# Patient Record
Sex: Female | Born: 1935 | Race: White | Hispanic: No | Marital: Married | Smoking: Never smoker
Health system: Southern US, Community
[De-identification: ages and names within clinical notes are randomized; demographics above are authoritative.]

## PROBLEM LIST (undated history)

## (undated) DIAGNOSIS — J45909 Unspecified asthma, uncomplicated: Secondary | ICD-10-CM

## (undated) HISTORY — PX: COLON RESECTION: SHX5231

## (undated) HISTORY — PX: BACK SURGERY: SHX140

## (undated) HISTORY — PX: CHOLECYSTECTOMY: SHX55

## (undated) HISTORY — PX: MINOR CARPAL TUNNEL: SHX6472

---

## 2017-04-21 ENCOUNTER — Ambulatory Visit: Payer: Medicare Other

## 2017-04-21 ENCOUNTER — Ambulatory Visit
Admission: EM | Admit: 2017-04-21 | Discharge: 2017-04-21 | Disposition: A | Discharge status: Home / Self Care | Payer: Medicare Other | Attending: Family Medicine | Admitting: Family Medicine

## 2017-04-21 DIAGNOSIS — E039 Hypothyroidism, unspecified: Secondary | ICD-10-CM | POA: Diagnosis not present

## 2017-04-21 DIAGNOSIS — K219 Gastro-esophageal reflux disease without esophagitis: Secondary | ICD-10-CM | POA: Diagnosis not present

## 2017-04-21 DIAGNOSIS — R110 Nausea: Secondary | ICD-10-CM

## 2017-04-21 DIAGNOSIS — R1013 Epigastric pain: Secondary | ICD-10-CM

## 2017-04-21 DIAGNOSIS — Z79899 Other long term (current) drug therapy: Secondary | ICD-10-CM | POA: Insufficient documentation

## 2017-04-21 DIAGNOSIS — K5900 Constipation, unspecified: Secondary | ICD-10-CM | POA: Insufficient documentation

## 2017-04-21 DIAGNOSIS — I10 Essential (primary) hypertension: Secondary | ICD-10-CM | POA: Insufficient documentation

## 2017-04-21 DIAGNOSIS — Z9049 Acquired absence of other specified parts of digestive tract: Secondary | ICD-10-CM | POA: Insufficient documentation

## 2017-04-21 HISTORY — DX: Unspecified asthma, uncomplicated: J45.909

## 2017-04-21 LAB — BASIC METABOLIC PANEL
Anion gap: 8 (ref 5–15)
BUN: 15 mg/dL (ref 6–20)
CALCIUM: 9 mg/dL (ref 8.9–10.3)
CO2: 29 mmol/L (ref 22–32)
CREATININE: 0.76 mg/dL (ref 0.44–1.00)
Chloride: 99 mmol/L — ABNORMAL LOW (ref 101–111)
GFR calc Af Amer: >60 mL/min (ref >60)
GLUCOSE: 120 mg/dL — AB (ref 65–99)
POTASSIUM: 3.5 mmol/L (ref 3.5–5.1)
SODIUM: 136 mmol/L (ref 135–145)

## 2017-04-21 LAB — CBC WITH DIFFERENTIAL/PLATELET
BASOS ABS: 0 10*3/uL (ref 0–0.1)
BASOS PCT: 1 %
EOS ABS: 0.1 10*3/uL (ref 0–0.7)
Eosinophils Relative: 3 %
HCT: 43 % (ref 35.0–47.0)
Hemoglobin: 14.5 g/dL (ref 12.0–16.0)
LYMPHS ABS: 1.6 10*3/uL (ref 1.0–3.6)
Lymphocytes Relative: 30 %
MCH: 30.1 pg (ref 26.0–34.0)
MCHC: 33.7 g/dL (ref 32.0–36.0)
MCV: 89.6 fL (ref 80.0–100.0)
Monocytes Absolute: 0.8 10*3/uL (ref 0.2–0.9)
Monocytes Relative: 14 %
NEUTROS PCT: 52 %
Neutro Abs: 2.9 10*3/uL (ref 1.4–6.5)
PLATELETS: 215 10*3/uL (ref 150–440)
RBC: 4.8 MIL/uL (ref 3.80–5.20)
RDW: 12.9 % (ref 11.5–14.5)
WBC: 5.5 10*3/uL (ref 3.6–11.0)

## 2017-04-21 LAB — LIPASE, BLOOD: Lipase: 28 U/L (ref 11–51)

## 2017-04-21 NOTE — ED Provider Notes (Signed)
[facility] ____________________________________________  Time seen: Approximately 3:05 PM  I have reviewed the triage vital signs and the nursing notes.   HISTORY  Chief Complaint Nausea   HPI Carolyn Olson is a 81 y.o. female the past medical history of colon resection secondary to frequent diverticulitis, constipation with impaction, hypertension, hypothyroidism, acid reflux presenting for evaluation of epigastric abdominal pain. Patient states that approximately 3-4 weeks ago she had a standard follow-up colonoscopy performed. Patient reports initially after the colonoscopy she was fine without complication. Patient reports the next week she had onset of right lower quadrant abdominal pain that is worse with leg movement and then was seen in the emergency room in which she had a negative workup including laboratory studies as well as negative CT scan per patient. Patient reports the next week she then began having constipation and she was impacted in which she mainly disimpacted and took Metamucil and Colace which relieved the impaction. Patient states she is insensate and not moving her bowels with normal as the for herself.   Patient reports over the last week she's been having intermittent nausea and burping. States over the last 3-4 days she has had increased burping, acid reflux sensation including burping up acidic and sour tasting fluids and some burning in her throat sensation. Denies sore throat. Patient states that these symptoms were consistent with her previous acid reflux but more persistent and not resolved with over-the-counter Alka-Seltzer. Patient states today the burning and the discomfort has fully resolved but continues with burping and intermittent epigastric abdominal pain. Patient states the abdominal pain comes and goes denies aggravating or alleviating factors. Reports when present she feels nauseated. States yesterday she made herself vomit once to  try to alleviate the pain and nausea but states unsuccessful. Denies fall, trauma. States felt warm yesterday, denies known fevers. Denies dysuria, back pain, chest pain, shortness breath, pain with deep breath. Denies runny nose, cough, congestion or sore throat. Reports continues to tolerate fluids well but not eating as much. Denies melena, hematochezia, blood in stool or toilet, abnormal colored stools. Denies diarrhea. Reports continues to pass gas normally. Patient states that she does not take her acid reflux medication everyday like she is supposed to. Reports otherwise feels well.  [place] in [place]. Patient reports she is here visiting her family. States to return home this coming Tuesday.    Past Medical History:  Diagnosis Date  . Asthma   Hypertension Constipation Acid reflux  There are no active problems to display for this patient.   Past Surgical History:  Procedure Laterality Date  . BACK SURGERY    . CHOLECYSTECTOMY    . COLON RESECTION    . MINOR CARPAL TUNNEL       No current facility-administered medications for this encounter.   Current Outpatient Prescriptions:  .  albuterol (PROVENTIL HFA;VENTOLIN HFA) 108 (90 Base) MCG/ACT inhaler, Inhale 2 puffs into the lungs every 6 (six) hours as needed for wheezing or shortness of breath., Disp: , Rfl:  .  amLODipine (NORVASC) 2.5 MG tablet, Take 2.5 mg by mouth daily., Disp: , Rfl:  .  budesonide (PULMICORT) 180 MCG/ACT inhaler, Inhale 1 puff into the lungs 2 (two) times daily., Disp: , Rfl:  .  furosemide (LASIX) 40 MG tablet, Take 40 mg by mouth., Disp: , Rfl:  .  levothyroxine (SYNTHROID, LEVOTHROID) 125 MCG tablet, Take 125 mcg by mouth daily before breakfast., Disp: , Rfl:  .  metoprolol tartrate (LOPRESSOR) 25 MG  tablet, Take 25 mg by mouth 2 (two) times daily., Disp: , Rfl:  .  montelukast (SINGULAIR) 10 MG tablet, Take 10 mg by mouth at bedtime., Disp: , Rfl:  .  omeprazole (PRILOSEC) 20 MG capsule, Take 20  mg by mouth daily., Disp: , Rfl:   Allergies Dye fdc red [red dye]; Prozac [fluoxetine hcl]; and Theophyllines  History reviewed. No pertinent family history.  Social History Social History  Substance Use Topics  . Smoking status: Never Smoker  . Smokeless tobacco: Never Used  . Alcohol use No    Review of Systems Constitutional: As above.  ENT: No sore throat. Cardiovascular: Denies chest pain. Respiratory: Denies shortness of breath. Gastrointestinal: As above. Reports had two bowel movements today and described as normal.  Genitourinary: Negative for dysuria. Musculoskeletal: Negative for back pain. Skin: Negative for rash.   ____________________________________________   PHYSICAL EXAM:  VITAL SIGNS: ED Triage Vitals  Enc Vitals Group     BP 04/21/17 1419 (!) 157/81     Pulse Rate 04/21/17 1419 90     Resp 04/21/17 1419 18     Temp 04/21/17 1419 98.8 F (37.1 C)     Temp Source 04/21/17 1419 Oral     SpO2 04/21/17 1419 97 %     Weight --      Height --      Head Circumference --      Peak Flow --      Pain Score 04/21/17 1421 10     Pain Loc --      Pain Edu? --      Excl. in GC? --     Constitutional: Alert and oriented. Well appearing and in no acute distress. ENT      Head: Normocephalic and atraumatic.      Mouth/Throat: Mucous membranes are moist.Oropharynx non-erythematous. Cardiovascular: Normal rate, regular rhythm. Grossly normal heart sounds.  Good peripheral circulation. Respiratory: Normal respiratory effort without tachypnea nor retractions. Breath sounds are clear and equal bilaterally. No wheezes, rales, rhonchi. Gastrointestinal: No CVA tenderness. Musculoskeletal:  No midline cervical, thoracic or lumbar tenderness to palpation. Ambulatory with steady gait.  Neurologic:  Normal speech and language. Speech is normal.  Skin:  Skin is warm, dry. Psychiatric: Mood and affect are normal. Speech and behavior are normal. Patient exhibits  appropriate insight and judgment   ___________________________________________   LABS (all labs ordered are listed, but only abnormal results are displayed)  Labs Reviewed  BASIC METABOLIC PANEL - Abnormal; Notable for the following:       Result Value   Chloride 99 (*)    Glucose, Bld 120 (*)    All other components within normal limits  CBC WITH DIFFERENTIAL/PLATELET  LIPASE, BLOOD    ED ECG REPORT I, Renford Dills, the attending provider and Dr Judd Gaudier, personally viewed and interpreted this ECG.   Date: 04/21/2017  EKG Time: 1513  Rate: 80  Rhythm: normal sinus rhythm  Axis: normal  Intervals:none  ST&T Change: none   RADIOLOGY  Dg Abdomen 1 View  Result Date: 04/21/2017 CLINICAL DATA:  Abdominal pain, recent colonoscopy EXAM: ABDOMEN - 1 VIEW COMPARISON:  None. FINDINGS: Normal bowel gas pattern. If there is concern of free air, upright or decubitus view suggested. Surgical bowel clips in the pelvis in the region of the rectum. Surgical clips in the region of the right kidney. Multilevel lumbar degenerative change and dextroscoliosis. IMPRESSION: No acute abnormality. Electronically Signed   By: Marlan Palau M.D.  On: 04/21/2017 15:39   ____________________________________________   PROCEDURES Procedures    INITIAL IMPRESSION / ASSESSMENT AND PLAN / ED COURSE  Pertinent labs & imaging results that were available during my care of the patient were reviewed by me and considered in my medical decision making (see chart for details). Discussed patient and plan of care with Dr Judd Gaudier, who agrees with plan.   Well-appearing patient. No acute distress. Patient with above past medical history and recent evaluation including colonoscopy as well as ER visit with reported CT scan. Patient states due to her IVP contrast allergy she was only had CT without contrast. Patient states that she feels like this is all secondary from previous colonoscopy. Discussed patient clinical  report suggestive of gastritis, but concern regarding past medical history. Discussed with patient we'll evaluate EKG, KUB and laboratory studies. Discussed the patient likely need endoscopy but may need further CT imaging rather imaging and evaluation in ER.   Laboratory results reviewed, appears overall unremarkable. KUB reviewed, per radiologist no acute abnormality noted. Discussed in detail with patient concern regarding gastritis and plan for patient to take oral omeprazole 20 mg twice daily. Patient then states that she was originally told to take it twice a day but states does not, and does not always even take it once daily. Encouraged patient to take twice daily as prescribed. Discussed avoiding or relieving factors and triggers. Discussed close monitoring. Discussed in detail with patient for any increased pain, vomiting, abnormal color stools or other concerns proceed directly to emergency room for further evaluation including likely other imaging. Discussed with patient following up with GI at home for likely endoscopy. Discussed indication, risks and benefits of medications with patient.patient states that she has plenty of omeprazole with her and does not need additional Rx.   Discussed follow up with Primary care physician this week. Discussed follow up and return parameters including no resolution or any worsening concerns. Patient verbalized understanding and agreed to plan.   ____________________________________________   FINAL CLINICAL IMPRESSION(S) / ED DIAGNOSES  Final diagnoses:  Epigastric abdominal pain     Discharge Medication List as of 04/21/2017  4:17 PM      Note: This dictation was prepared with Dragon dictation along with smaller phrase technology. Any transcriptional errors that result from this process are unintentional.           Renford Dills, NP 04/21/17 1630

## 2017-04-21 NOTE — Discharge Instructions (Signed)
Take omeprazole twice daily on a regular basis. Rest. Drink plenty of fluids. Avoid aggravating factors.   Follow up with your primary care physician or your gastroenterologist this week. Return to Urgent care or Emergency room for increased pain, vomiting, blood in stool, new or worsening concerns.

## 2017-04-21 NOTE — ED Triage Notes (Addendum)
Pt c/o upper abdominal pain, that comes and goes. Had a colonoscopy 3 weeks ago and ever since then she has had nausea and pain in the center of her abdomen. Last BM this am. Constant nausea. She had a pain in her lower hip area anterior and was taken to the hospital in [place], they did blood work, and scans and everything was clear. She feels this is all coming from the colonoscopy. She was severely constipated and had to manually get stool out a couple weeks ago, and used some over the counter medication to help. C/O heartburn.

## 2018-09-12 IMAGING — CR DG ABDOMEN 1V
2 series · 2 of 2 positions shown · non-contrast
Comparison: None.

CLINICAL DATA: Abdominal pain, recent colonoscopy

EXAM:
ABDOMEN - 1 VIEW

[abdomen kub (1 of 2)]
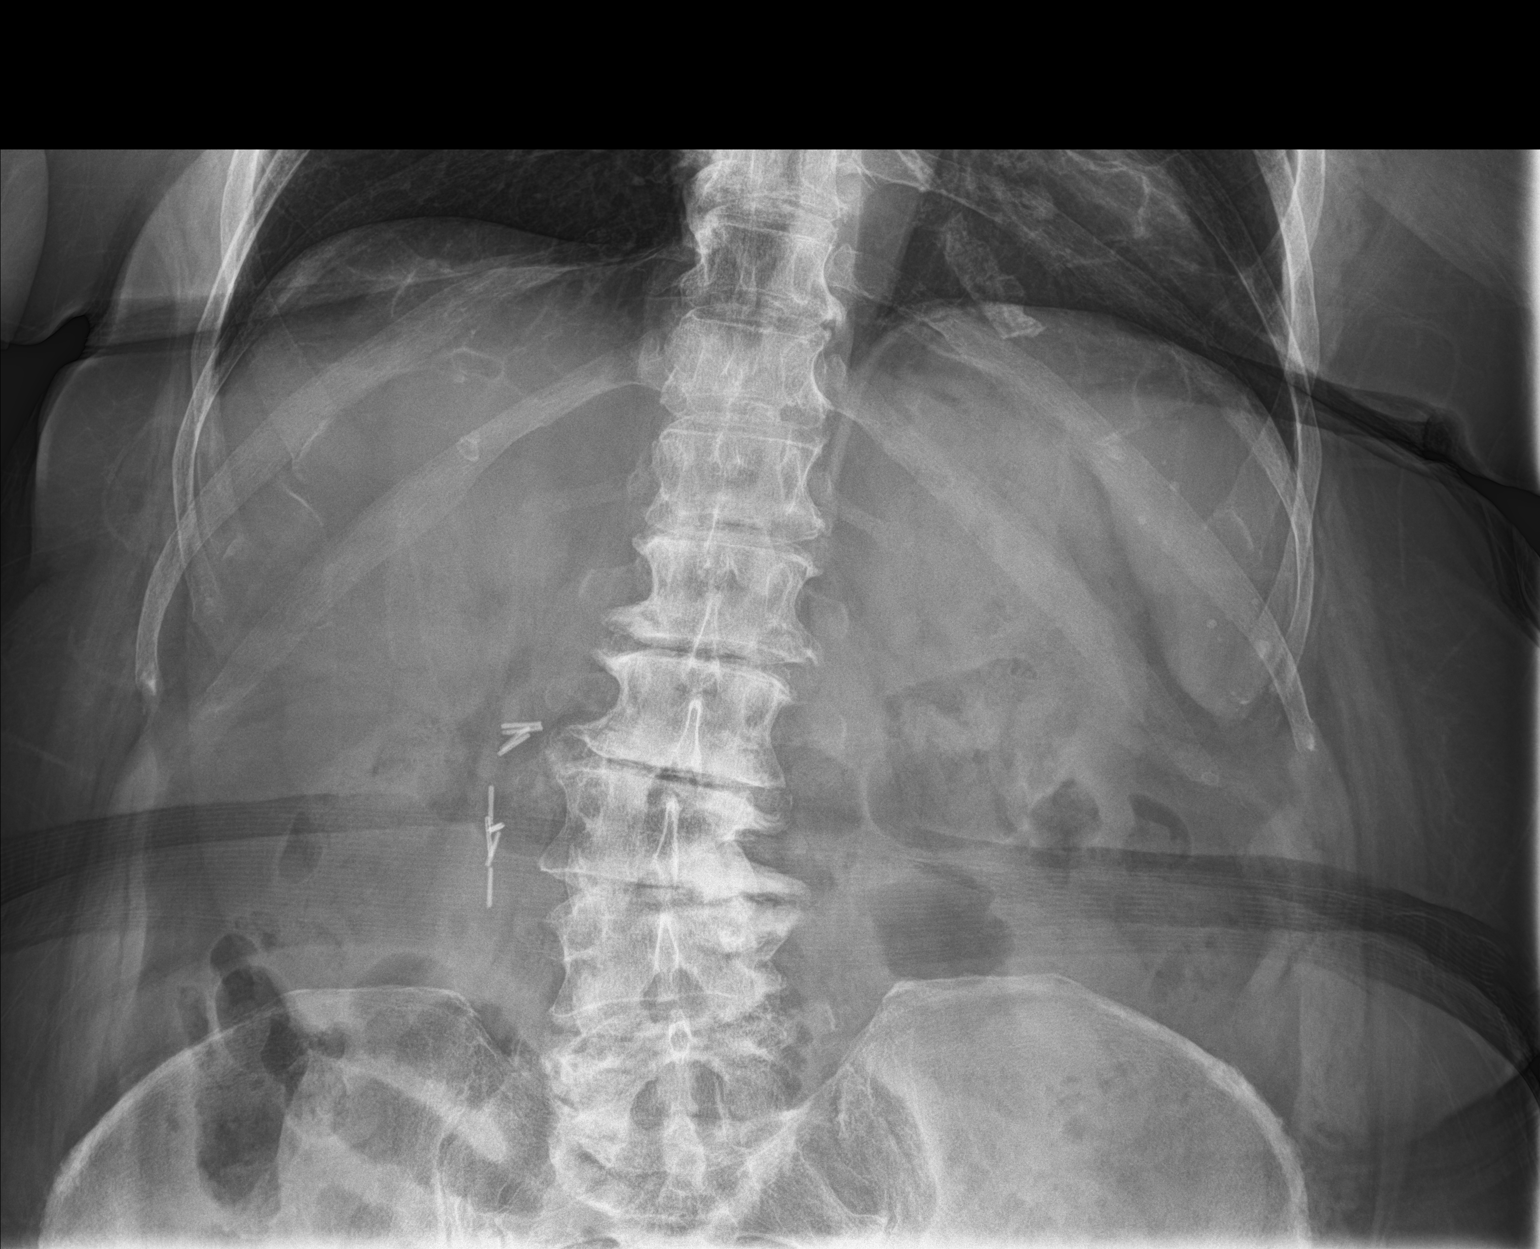

[abdomen kub (2 of 2)]
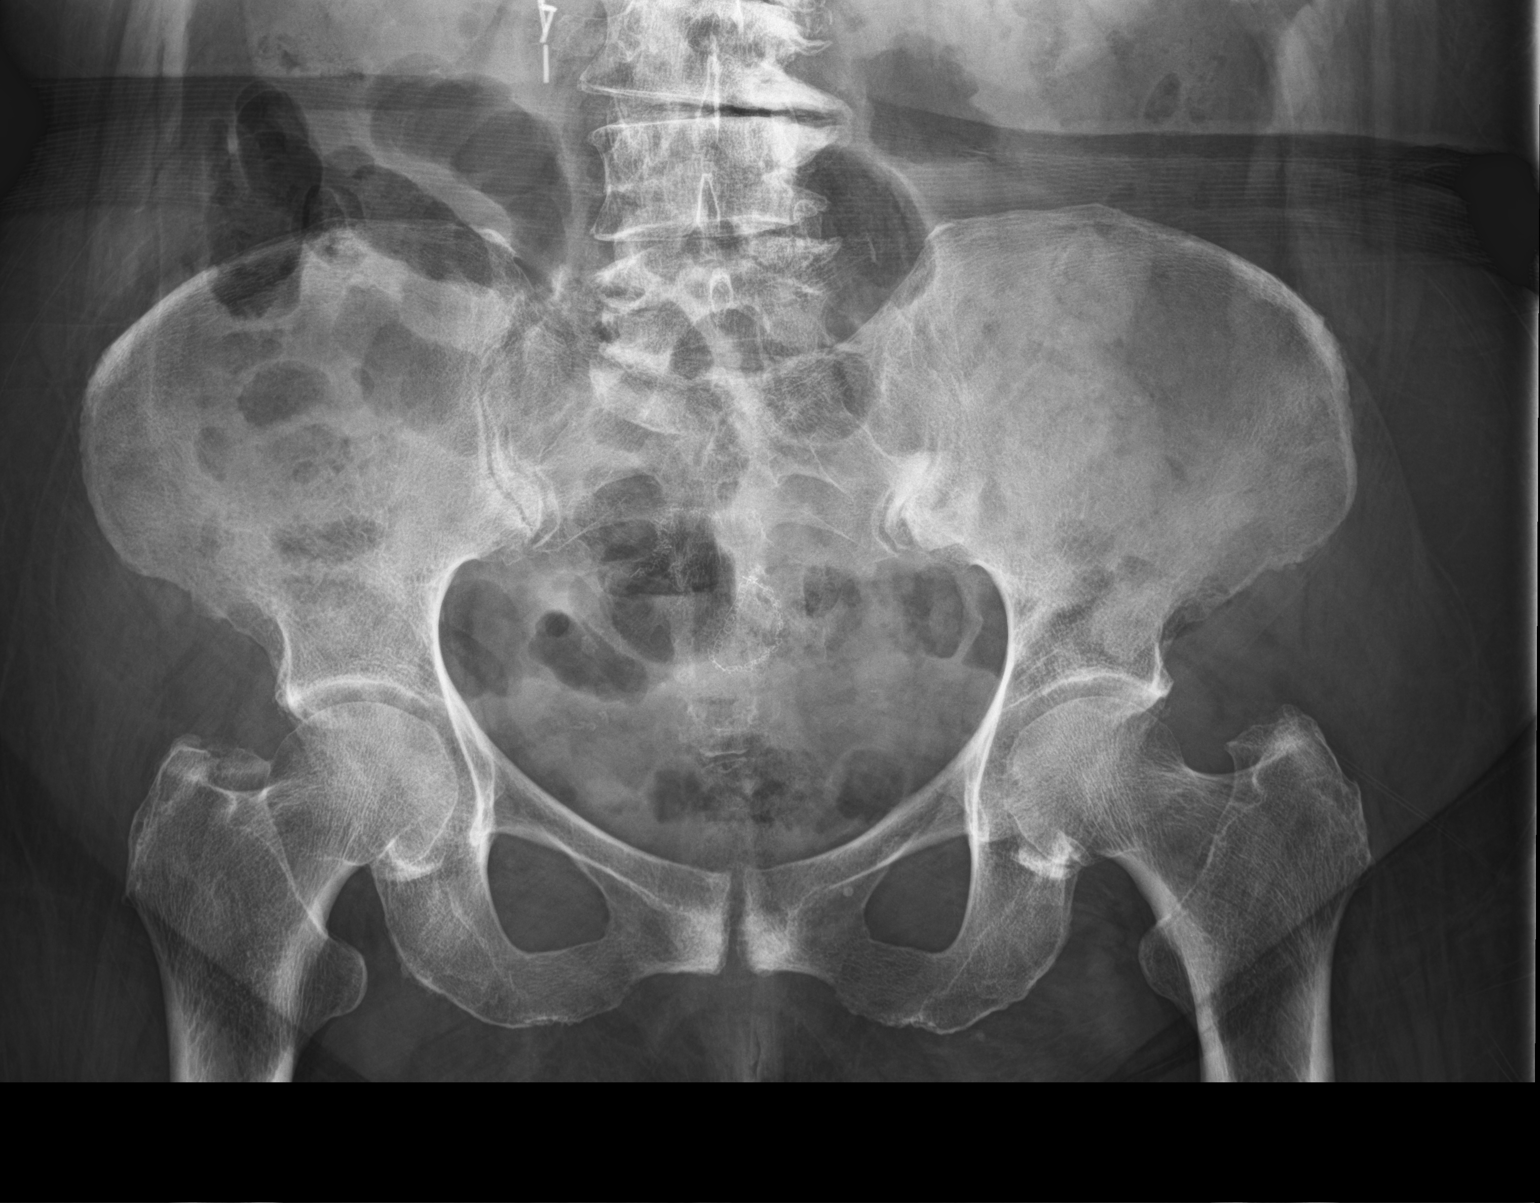

[2 of 2 positions shown; findings below may reference images not displayed]

FINDINGS: Normal bowel gas pattern. If there is concern of free air, upright
or decubitus view suggested.

Surgical bowel clips in the pelvis in the region of the rectum.
Surgical clips in the region of the right kidney. Multilevel lumbar
degenerative change and dextroscoliosis.
IMPRESSION: No acute abnormality.

## 2021-11-10 DEATH — deceased
